# Patient Record
Sex: Male | Born: 2000 | Race: White | Hispanic: No | Marital: Single | Smoking: Former smoker
Health system: Southern US, Community
[De-identification: ages and names within clinical notes are randomized; demographics above are authoritative.]

---
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
## 2019-04-26 IMAGING — DX DG CHEST 1V PORT
1 series · 1 of 1 positions shown · non-contrast
Comparison: Chest x-rays from earlier same day.

CLINICAL DATA: Chest x-ray from earlier same day. Increased heart
rate, chest pain, N/V, and sensation of decreased air intake. Chest
tube has been in place for hemothorax.

EXAM:
PORTABLE CHEST 1 VIEW

[chest ap]
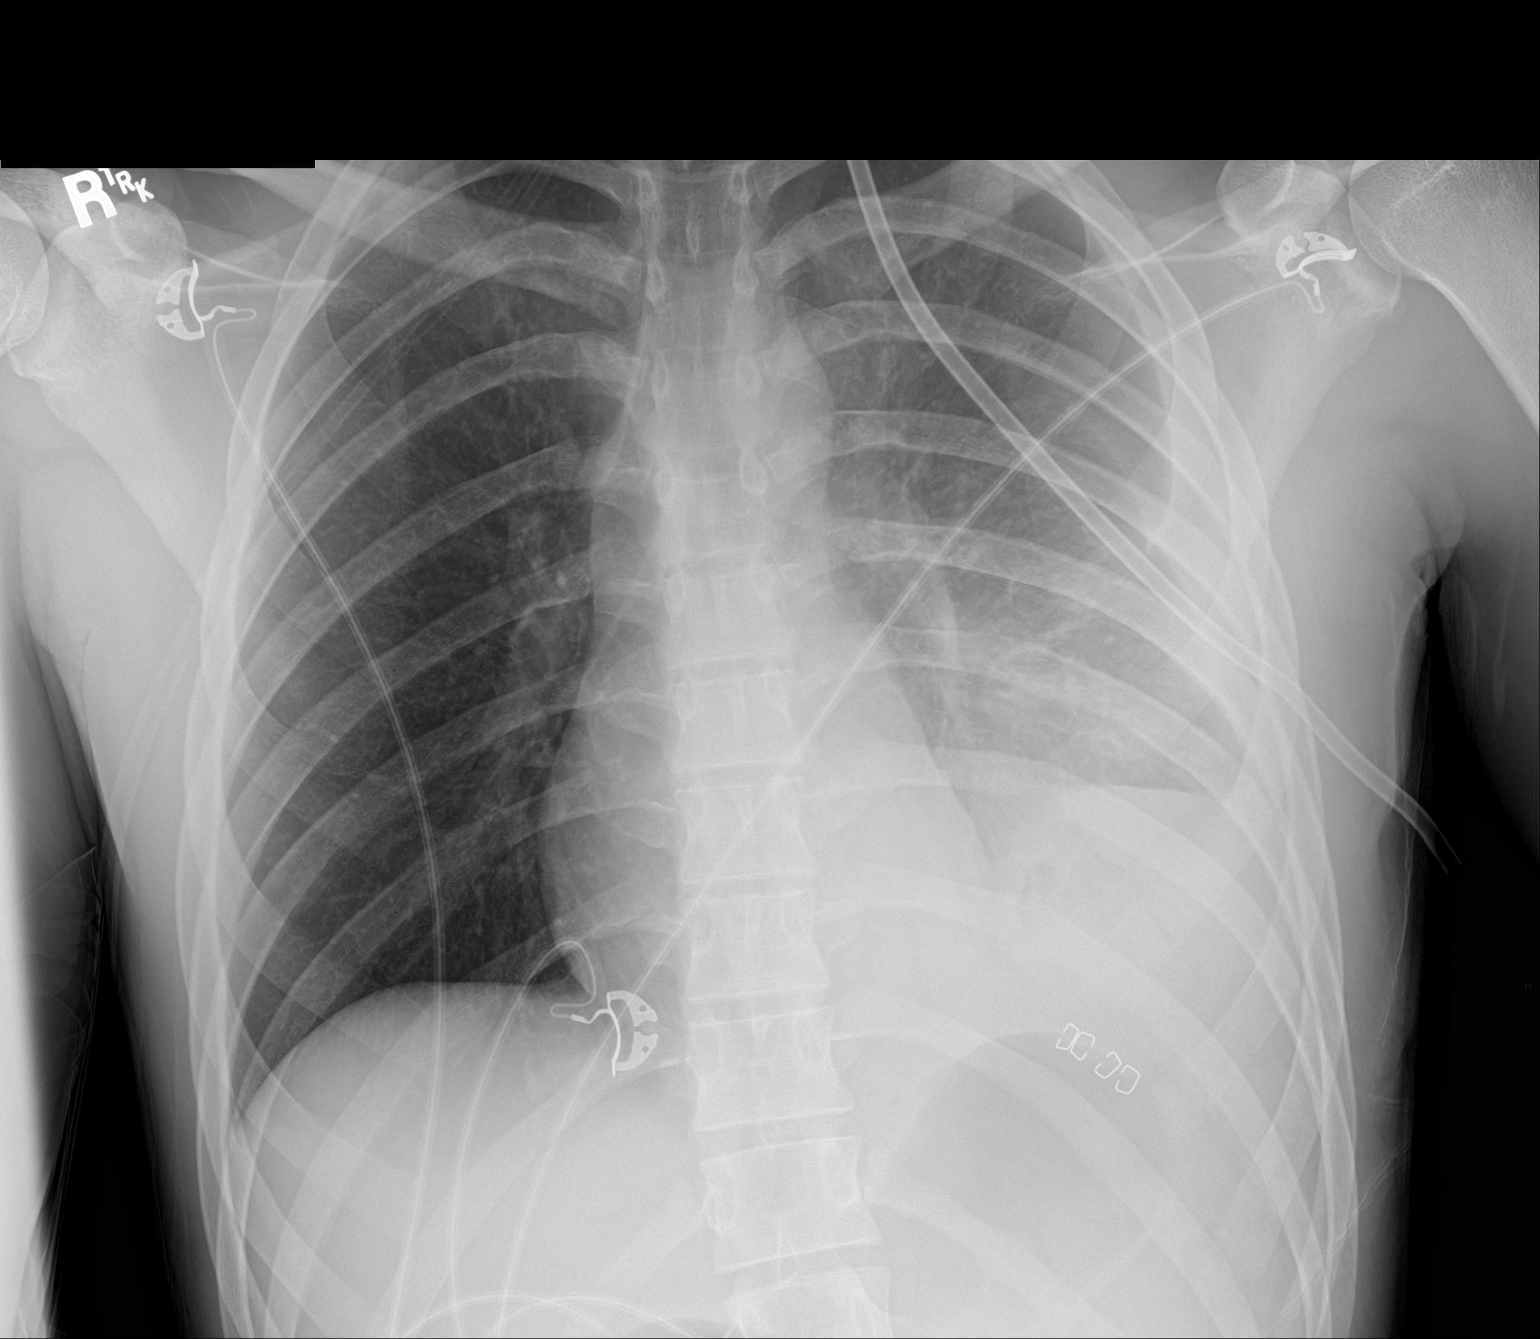

[1 of 1 positions shown; findings below may reference images not displayed]

FINDINGS: Left-sided chest tube is stable in position with tip directed
towards the left lung apex. There is increasing pleural fluid at the
left lung base, with air-fluid level indicating hydropneumothorax.
The component of pneumothorax previously demonstrated at the left
lung apex is no longer seen.

Cardiomediastinal structures are appropriately located in the
midline. Heart size is normal. Right lung is clear.
IMPRESSION: 1. Increasing pleural fluid at the left lung base, with air-fluid
level indicating hydropneumothorax.
2. The component of the previously demonstrated pneumothorax at the
left lung apex is no longer seen status post chest tube placement.
3. Right lung is clear.

These results will be called to the ordering clinician or
representative by the Radiologist Assistant, and communication
documented in the PACS or zVision Dashboard.

## 2019-04-27 IMAGING — DX DG CHEST 1V PORT
1 series · 1 of 1 positions shown · non-contrast
Comparison: Chest radiograph from earlier today.

CLINICAL DATA: Left hemothorax status post chest tube placement.
Central line placement.

EXAM:
PORTABLE CHEST 1 VIEW

[chest ap]
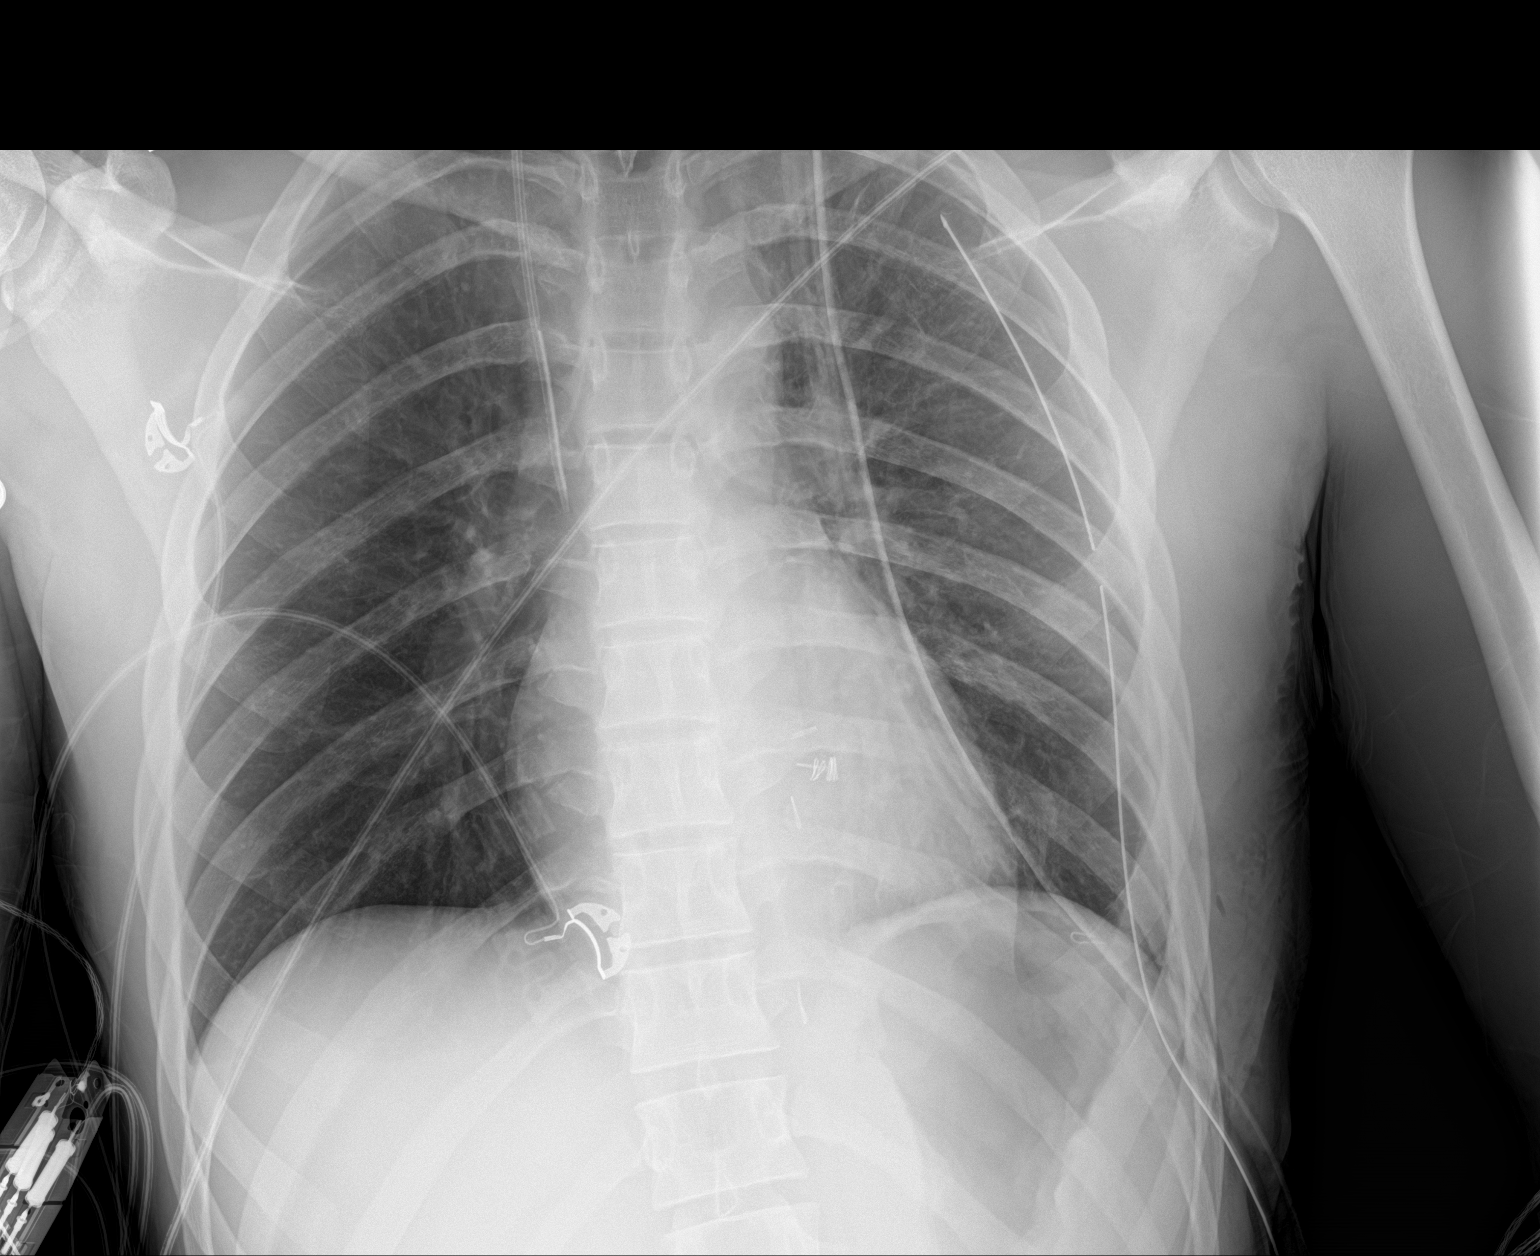

[1 of 1 positions shown; findings below may reference images not displayed]

FINDINGS: Right internal jugular central venous catheter terminates in the
middle third of the superior vena cava. Two left chest tubes are in
place terminating in the apical left pleural space. Surgical clips
overlie left lower chest. Mild subcutaneous emphysema in the lateral
lower left chest wall. Stable cardiomediastinal silhouette with
normal heart size. No evident pneumothorax. No pulmonary edema. No
pleural effusions. No acute consolidative airspace disease. Minimal
left basilar atelectasis, decreased.
IMPRESSION: 1. Two left apical chest tubes are in place. No pneumothorax. No
pleural effusions.
2. Right internal jugular central venous catheter terminates in the
middle third of the SVC.
3. Minimal residual atelectasis at the left lung base, decreased.

## 2019-04-29 IMAGING — DX DG CHEST 1V PORT
1 series · 1 of 1 positions shown · non-contrast
Comparison: 04/11/2017 and CT chest 04/10/2017.

CLINICAL DATA: Chest tubes, left VATS, hemothorax drainage. Left
chest pain and numbness.

EXAM:
PORTABLE CHEST 1 VIEW

[chest ap]
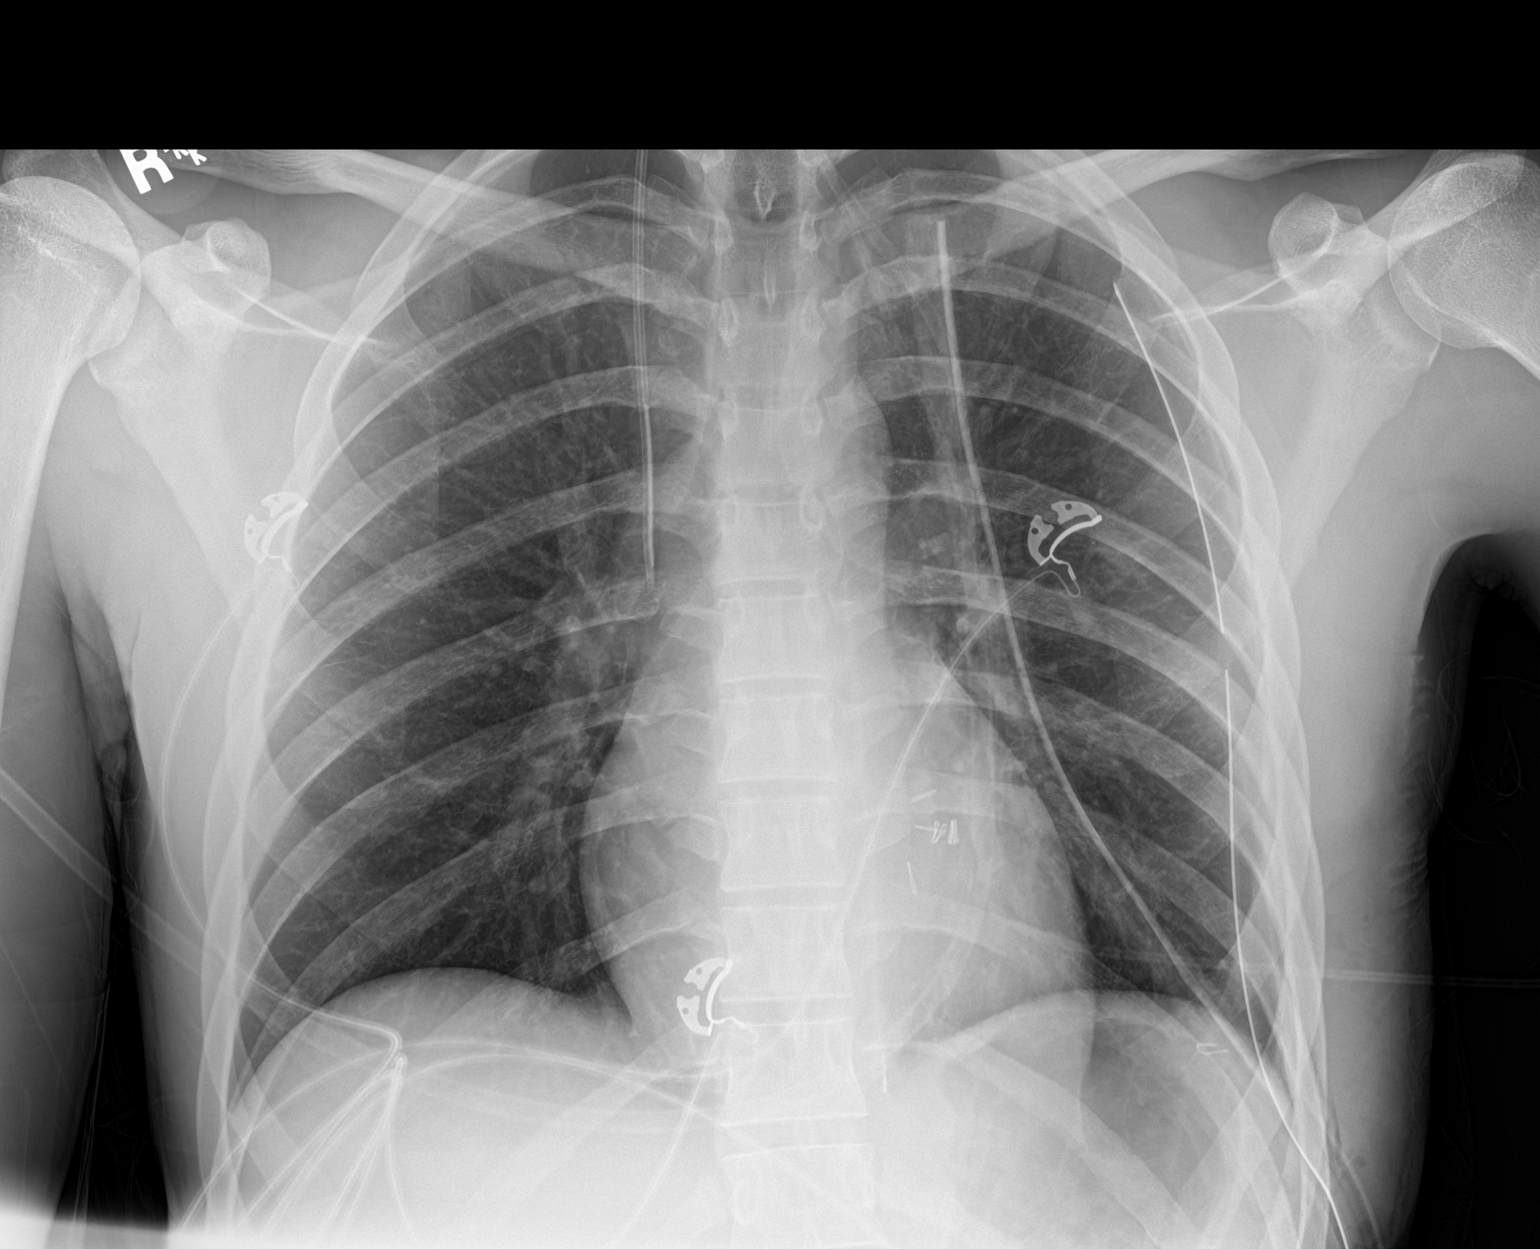

[1 of 1 positions shown; findings below may reference images not displayed]

FINDINGS: Right IJ central line tip projects over the SVC. Two left chest
tubes terminate in the upper left hemithorax. No definite
pneumothorax. Lungs are clear. No pleural fluid. Small amount of
subcutaneous air is seen in the lower left chest wall.
IMPRESSION: Two left chest tubes in place without pneumothorax or pleural fluid.

## 2019-04-30 IMAGING — DX DG CHEST 1V PORT
1 series · 1 of 1 positions shown · non-contrast
Comparison: April 12, 2017

CLINICAL DATA: Chest tube removal

EXAM:
PORTABLE CHEST 1 VIEW

[chest ap]
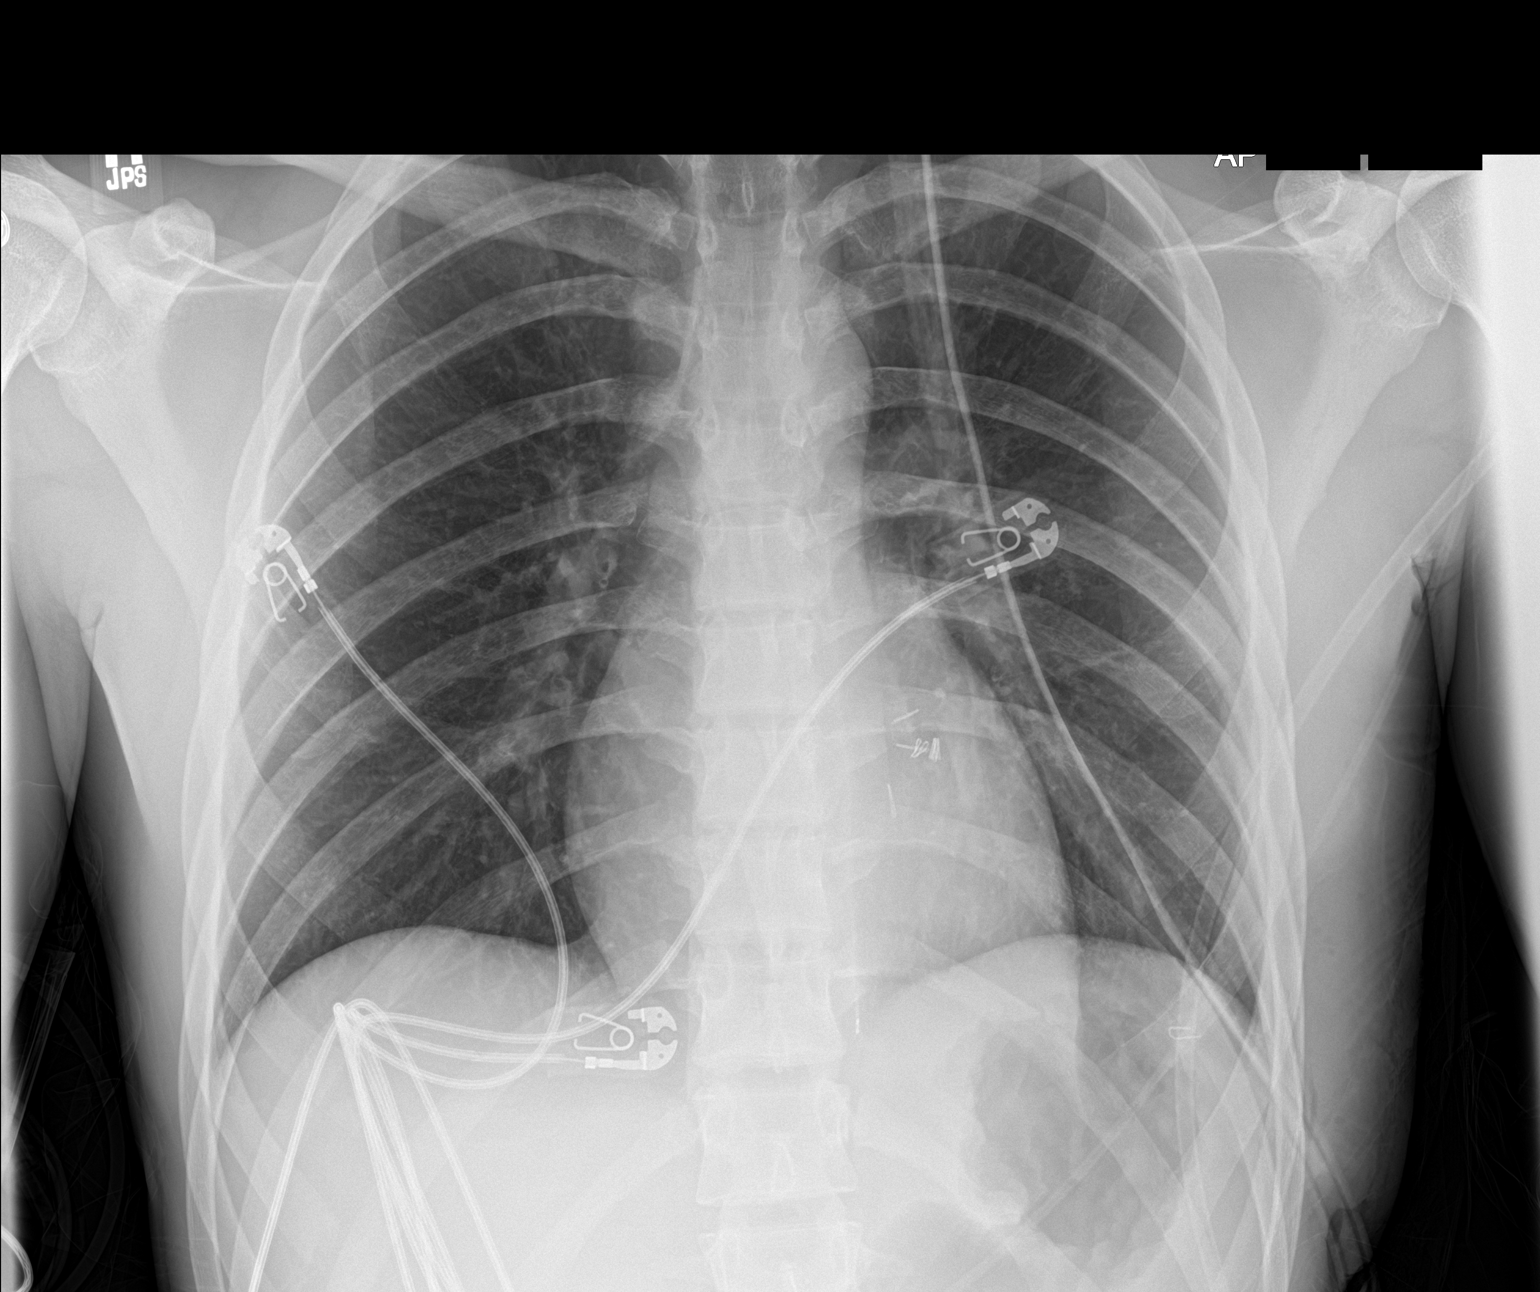

[1 of 1 positions shown; findings below may reference images not displayed]

FINDINGS: The more laterally positioned chest tube on the left has been
removed. Central catheter is been removed. Chest tube remains more
medially on the left. There is a small left apical pneumothorax
without tension component.

There is postoperative change on the left inferomedially. No edema
or consolidation. Heart size and pulmonary vascularity are normal.
No adenopathy. No bone lesions.
IMPRESSION: More medial chest tube remains on the left with the more lateral
chest tube removed. Central catheter also removed. There is a small
left apical pneumothorax without tension component. No edema or
consolidation. Stable cardiac silhouette.

## 2023-09-30 ENCOUNTER — Ambulatory Visit (HOSPITAL_BASED_OUTPATIENT_CLINIC_OR_DEPARTMENT_OTHER)
Admission: RE | Admit: 2023-09-30 | Discharge: 2023-09-30 | Disposition: A | Discharge status: Home / Self Care | Source: Ambulatory Visit | Attending: Family Medicine | Admitting: Family Medicine

## 2023-09-30 ENCOUNTER — Encounter (HOSPITAL_BASED_OUTPATIENT_CLINIC_OR_DEPARTMENT_OTHER): Payer: Self-pay

## 2023-09-30 VITALS — BP 143/89 | HR 73 | Temp 99.0°F | Resp 20

## 2023-09-30 DIAGNOSIS — Z711 Person with feared health complaint in whom no diagnosis is made: Secondary | ICD-10-CM

## 2023-09-30 LAB — POCT URINE DIPSTICK
Bilirubin, UA: NEGATIVE
Blood, UA: NEGATIVE
Glucose, UA: NEGATIVE mg/dL
Ketones, POC UA: NEGATIVE mg/dL
Leukocytes, UA: NEGATIVE
Nitrite, UA: NEGATIVE
POC PROTEIN,UA: NEGATIVE
Spec Grav, UA: 1.02 (ref 1.010–1.025)
Urobilinogen, UA: 0.2 U/dL
pH, UA: 8.5 — AB (ref 5.0–8.0)

## 2023-09-30 MED ORDER — CEFTRIAXONE SODIUM 1 G IJ SOLR
1.0000 g | Freq: Once | INTRAMUSCULAR | Status: AC
  Administered 2023-09-30: 1 g via INTRAMUSCULAR

## 2023-09-30 MED ORDER — AZITHROMYCIN 500 MG PO TABS
1000.0000 mg | ORAL_TABLET | Freq: Once | ORAL | Status: AC
  Administered 2023-09-30: 1000 mg via ORAL

## 2023-09-30 NOTE — ED Provider Notes (Signed)
 [facility]    CSN: [id number] Arrival date & time: 09/30/23  1520      History   Chief Complaint Chief Complaint  Patient presents with   SEXUALLY TRANSMITTED DISEASE    burning and pain with urination, testicular pain, frequent urination, bladder pain - Entered by patient    HPI Briscoe Daniello is a 23 y.o. male.   Patient is a 23 year old male who presents today with concerns for STD.  Patient reporting burning with urination, frequent urination, transient testicular pain. Reports symptoms x approx 3 weeks. States abd pain as well. Denies discharge. States was intimate with a woman approx 4 weeks ago who was on her menstrual cycle and unsure if that created more bacteria. Father with hx of bladder cancer.      History reviewed. No pertinent past medical history.  Patient Active Problem List   Diagnosis Date Noted   Second degree AV block    Hemothorax 04/10/2017   Hemopneumothorax on left 04/09/2017    Past Surgical History:  Procedure Laterality Date   VIDEO ASSISTED THORACOSCOPY (VATS)/EMPYEMA Left 04/10/2017   Procedure: VIDEO ASSISTED THORACOSCOPY (VATS)DRAINAGE HEMOTHORAX;  Surgeon: Fleeta Hanford Coy, MD;  Location: [facility] OR;  Service: Thoracic;  Laterality: Left;       Home Medications    Prior to Admission medications   Not on File    Family History Family History  Problem Relation Age of Onset   Hypertension Father     Social History Social History   Tobacco Use   Smoking status: Former   Smokeless tobacco: Never  Vaping Use   Vaping status: Never Used  Substance Use Topics   Alcohol use: Yes   Drug use: Never     Allergies   Aspirin   Review of Systems Review of Systems See HPI  Physical Exam Triage Vital Signs ED Triage Vitals  Encounter Vitals Group     BP 09/30/23 1548 (!) 143/89     Girls Systolic BP Percentile --      Girls Diastolic BP Percentile --      Boys Systolic BP Percentile --      Boys Diastolic BP  Percentile --      Pulse Rate 09/30/23 1548 73     Resp 09/30/23 1548 20     Temp 09/30/23 1548 99 F (37.2 C)     Temp Source 09/30/23 1548 Oral     SpO2 09/30/23 1548 98 %     Weight --      Height --      Head Circumference --      Peak Flow --      Pain Score 09/30/23 1550 5     Pain Loc --      Pain Education --      Exclude from Growth Chart --    No data found.  Updated Vital Signs BP (!) 143/89 (BP Location: Right Arm)   Pulse 73   Temp 99 F (37.2 C) (Oral)   Resp 20   SpO2 98%   Visual Acuity Right Eye Distance:   Left Eye Distance:   Bilateral Distance:    Right Eye Near:   Left Eye Near:    Bilateral Near:     Physical Exam Constitutional:      Appearance: Normal appearance.  Pulmonary:     Effort: Pulmonary effort is normal.  Musculoskeletal:        General: Normal range of motion.  Neurological:  Mental Status: He is alert.  Psychiatric:        Mood and Affect: Mood normal.      UC Treatments / Results  Labs (all labs ordered are listed, but only abnormal results are displayed) Labs Reviewed  POCT URINE DIPSTICK - Abnormal; Notable for the following components:      Result Value   Color, UA light yellow (*)    Clarity, UA cloudy (*)    pH, UA 8.5 (*)    All other components within normal limits  HIV ANTIBODY (ROUTINE TESTING W REFLEX)  RPR  CYTOLOGY, (ORAL, ANAL, URETHRAL) ANCILLARY ONLY    EKG   Radiology No results found.  Procedures Procedures (including critical care time)  Medications Ordered in UC Medications  azithromycin  (ZITHROMAX ) tablet 1,000 mg (1,000 mg Oral Given 09/30/23 1630)  cefTRIAXone  (ROCEPHIN ) injection 1 g (1 g Intramuscular Given 09/30/23 1631)    Initial Impression / Assessment and Plan / UC Course  I have reviewed the triage vital signs and the nursing notes.  Pertinent labs & imaging results that were available during my care of the patient were reviewed by me and considered in my medical  decision making (see chart for details).     Concern for STD in male without diagnosis-urine cloudy here today but otherwise did not show any signs of urinary tract infection.  Did have foul smell.  He is also had some penile irritation, itching and testicular discomfort.  Concern for possible STD at this time.  Will go ahead and treat prophylactically today with for gonorrhea and chlamydia with Rocephin  and azithromycin . Swab obtained here today for testing Also obtain blood work for further STD testing Will call with any positive results. Follow-up as needed Final Clinical Impressions(s) / UC Diagnoses   Final diagnoses:  Concern about STD in male without diagnosis     Discharge Instructions      We have treated you today for potential gonorrhea and chlamydia.  We are sending a swab for further testing. Will call you with any positive results.  We are also checking for HIV.  You can check your MyChart also for results.     ED Prescriptions   None    PDMP not reviewed this encounter.   Adah Wilbert LABOR, FNP 09/30/23 [phone]

## 2023-09-30 NOTE — ED Triage Notes (Signed)
 Patient reporting burning with urination, frequent urination, transient testicular pain. Reports symptoms x approx 3 weeks. States abd pain as well. Denies discharge. States was intimate with a woman approx 4 weeks ago who was on her menstrual cycle and unsure if that created more bacteria. Father with hx of bladder cancer.

## 2023-09-30 NOTE — Discharge Instructions (Signed)
 We have treated you today for potential gonorrhea and chlamydia.  We are sending a swab for further testing. Will call you with any positive results.  We are also checking for HIV.  You can check your MyChart also for results.

## 2023-09-30 NOTE — ED Notes (Signed)
 No adverse reactions from IM Rocephin . Patient discharged.

## 2023-10-01 LAB — CYTOLOGY, (ORAL, ANAL, URETHRAL) ANCILLARY ONLY
Chlamydia: NEGATIVE
Comment: NEGATIVE
Comment: NEGATIVE
Comment: NORMAL
Neisseria Gonorrhea: NEGATIVE
Trichomonas: NEGATIVE

## 2023-10-05 LAB — HIV ANTIBODY (ROUTINE TESTING W REFLEX): HIV Screen 4th Generation wRfx: NONREACTIVE

## 2023-10-05 LAB — SPECIMEN STATUS REPORT

## 2023-10-05 LAB — RPR: RPR Ser Ql: NONREACTIVE

## 2023-11-22 ENCOUNTER — Encounter (HOSPITAL_BASED_OUTPATIENT_CLINIC_OR_DEPARTMENT_OTHER): Payer: Self-pay

## 2023-11-22 ENCOUNTER — Ambulatory Visit (HOSPITAL_BASED_OUTPATIENT_CLINIC_OR_DEPARTMENT_OTHER)
Admission: EM | Admit: 2023-11-22 | Discharge: 2023-11-22 | Disposition: A | Discharge status: Home / Self Care | Attending: Family Medicine | Admitting: Family Medicine

## 2023-11-22 DIAGNOSIS — R590 Localized enlarged lymph nodes: Secondary | ICD-10-CM | POA: Diagnosis present

## 2023-11-22 DIAGNOSIS — R8290 Unspecified abnormal findings in urine: Secondary | ICD-10-CM | POA: Diagnosis present

## 2023-11-22 DIAGNOSIS — N451 Epididymitis: Secondary | ICD-10-CM | POA: Insufficient documentation

## 2023-11-22 DIAGNOSIS — N50812 Left testicular pain: Secondary | ICD-10-CM | POA: Insufficient documentation

## 2023-11-22 DIAGNOSIS — N50811 Right testicular pain: Secondary | ICD-10-CM | POA: Insufficient documentation

## 2023-11-22 DIAGNOSIS — N4889 Other specified disorders of penis: Secondary | ICD-10-CM | POA: Diagnosis present

## 2023-11-22 DIAGNOSIS — R300 Dysuria: Secondary | ICD-10-CM | POA: Insufficient documentation

## 2023-11-22 LAB — POCT URINE DIPSTICK
Bilirubin, UA: NEGATIVE
Blood, UA: NEGATIVE
Glucose, UA: NEGATIVE mg/dL
Ketones, POC UA: NEGATIVE mg/dL
Leukocytes, UA: NEGATIVE
Nitrite, UA: NEGATIVE
Protein Ur, POC: NEGATIVE mg/dL
Spec Grav, UA: 1.025 (ref 1.010–1.025)
Urobilinogen, UA: 0.2 U/dL
pH, UA: 7 (ref 5.0–8.0)

## 2023-11-22 MED ORDER — SULFAMETHOXAZOLE-TRIMETHOPRIM 800-160 MG PO TABS: 1.0000 | ORAL_TABLET | Freq: Two times a day (BID) | ORAL | 0 refills | Status: AC

## 2023-11-22 NOTE — ED Provider Notes (Signed)
 [facility]    CSN: [id number] Arrival date & time: 11/22/23  1628      History   Chief Complaint Chief Complaint  Patient presents with   Penis Pain   Testicle Pain   Urinary Frequency    HPI Harvie Italiano is a 23 y.o. male.   Patient was seen on 09/30/2023 here at this urgent care.  He was having penile pain, testicular pain, some penile burning with urination and dark urine and urinary frequency.  He was tested for STIs and everything was negative (HIV, RPR, gonorrhea, chlamydia, trichomoniasis all negative).  Patient reports she still having penile pain, testicular pain, urinary frequency and dark cloudy urine.  He denies any penile discharge, burning with urination, rash or lesions.  He has not taken anything for this pain.   Penis Pain Pertinent negatives include no chest pain and no abdominal pain.  Testicle Pain Pertinent negatives include no chest pain and no abdominal pain.  Urinary Frequency Pertinent negatives include no chest pain and no abdominal pain.    History reviewed. No pertinent past medical history.  Patient Active Problem List   Diagnosis Date Noted   Second degree AV block    Hemothorax 04/10/2017   Hemopneumothorax on left 04/09/2017    Past Surgical History:  Procedure Laterality Date   VIDEO ASSISTED THORACOSCOPY (VATS)/EMPYEMA Left 04/10/2017   Procedure: VIDEO ASSISTED THORACOSCOPY (VATS)DRAINAGE HEMOTHORAX;  Surgeon: Fleeta Hanford Coy, MD;  Location: [facility] OR;  Service: Thoracic;  Laterality: Left;       Home Medications    Prior to Admission medications   Medication Sig Start Date End Date Taking? Authorizing Provider  sulfamethoxazole-trimethoprim (BACTRIM DS) 800-160 MG tablet Take 1 tablet by mouth 2 (two) times daily for 14 days. 11/22/23 12/06/23 Yes Ival Domino, FNP    Family History Family History  Problem Relation Age of Onset   Hypertension Father     Social History Social History   Tobacco Use    Smoking status: Former   Smokeless tobacco: Never  [employer]   Vaping status: Never Used  Substance Use Topics   Alcohol use: Yes   Drug use: Never     Allergies   Aspirin   Review of Systems Review of Systems  Constitutional:  Negative for chills and fever.  HENT:  Negative for ear pain and sore throat.   Eyes:  Negative for pain and visual disturbance.  Respiratory:  Negative for cough.   Cardiovascular:  Negative for chest pain and palpitations.  Gastrointestinal:  Negative for abdominal pain, constipation, diarrhea, nausea and vomiting.  Genitourinary:  Positive for dysuria, frequency, penile pain and testicular pain. Negative for hematuria.  Musculoskeletal:  Negative for arthralgias and back pain.  Skin:  Negative for color change and rash.  Neurological:  Negative for seizures and syncope.  All other systems reviewed and are negative.    Physical Exam Triage Vital Signs ED Triage Vitals  Encounter Vitals Group     BP 11/22/23 1737 (!) 146/92     Girls Systolic BP Percentile --      Girls Diastolic BP Percentile --      Boys Systolic BP Percentile --      Boys Diastolic BP Percentile --      Pulse Rate 11/22/23 1737 79     Resp 11/22/23 1737 20     Temp 11/22/23 1737 98.3 F (36.8 C)     Temp Source 11/22/23 1737 Oral     SpO2 11/22/23  1737 96 %     Weight --      Height --      Head Circumference --      Peak Flow --      Pain Score 11/22/23 1735 3     Pain Loc --      Pain Education --      Exclude from Growth Chart --    No data found.  Updated Vital Signs BP (!) 146/92 (BP Location: Right Arm)   Pulse 79   Temp 98.3 F (36.8 C) (Oral)   Resp 20   SpO2 96%   Visual Acuity Right Eye Distance:   Left Eye Distance:   Bilateral Distance:    Right Eye Near:   Left Eye Near:    Bilateral Near:     Physical Exam Vitals and nursing note reviewed. Exam conducted with a chaperone present LONNY Sayres, CMA).  Constitutional:      General: He is  not in acute distress.    Appearance: He is well-developed. He is not ill-appearing or toxic-appearing.  HENT:     Head: Normocephalic and atraumatic.     Right Ear: Hearing, tympanic membrane, ear canal and external ear normal.     Left Ear: Hearing, tympanic membrane, ear canal and external ear normal.     Nose: No congestion or rhinorrhea.     Right Sinus: No maxillary sinus tenderness or frontal sinus tenderness.     Left Sinus: No maxillary sinus tenderness or frontal sinus tenderness.     Mouth/Throat:     Lips: Pink.     Mouth: Mucous membranes are moist.     Pharynx: Uvula midline. No oropharyngeal exudate or posterior oropharyngeal erythema.     Tonsils: No tonsillar exudate.  Eyes:     Conjunctiva/sclera: Conjunctivae normal.     Pupils: Pupils are equal, round, and reactive to light.  Cardiovascular:     Rate and Rhythm: Normal rate and regular rhythm.     Heart sounds: S1 normal and S2 normal. No murmur heard. Pulmonary:     Effort: Pulmonary effort is normal. No respiratory distress.     Breath sounds: Normal breath sounds. No decreased breath sounds, wheezing, rhonchi or rales.  Abdominal:     General: Bowel sounds are normal.     Palpations: Abdomen is soft.     Tenderness: There is no abdominal tenderness.     Hernia: There is no hernia in the left inguinal area or right inguinal area.  Genitourinary:    Pubic Area: No rash or pubic lice.      Penis: Circumcised. No phimosis, paraphimosis, hypospadias, erythema, tenderness, discharge, swelling or lesions.      Testes:        Right: Tenderness (Minimal - mild) present. Mass, swelling, testicular hydrocele or varicocele not present. Right testis is descended. Cremasteric reflex is present.         Left: Tenderness (Minimal - mild) present. Mass, swelling, testicular hydrocele or varicocele not present. Left testis is descended. Cremasteric reflex is present.      Epididymis:     Right: Not inflamed or enlarged.  Tenderness present. No mass.     Left: Not inflamed or enlarged. Tenderness present. No mass.     Comments: Did not perform a digital rectal exam. Musculoskeletal:        General: No swelling.     Cervical back: Neck supple.  Lymphadenopathy:     Head:     Right  side of head: No submental, submandibular, tonsillar, preauricular or posterior auricular adenopathy.     Left side of head: No submental, submandibular, tonsillar, preauricular or posterior auricular adenopathy.     Cervical: No cervical adenopathy.     Right cervical: No superficial cervical adenopathy.    Left cervical: No superficial cervical adenopathy.     Lower Body: Right inguinal adenopathy present. Left inguinal adenopathy present.  Skin:    General: Skin is warm and dry.     Capillary Refill: Capillary refill takes less than 2 seconds.     Findings: No rash.  Neurological:     Mental Status: He is alert and oriented to person, place, and time.  Psychiatric:        Mood and Affect: Mood normal.      UC Treatments / Results  Labs (all labs ordered are listed, but only abnormal results are displayed) Labs Reviewed  POCT URINE DIPSTICK - Normal  URINE CULTURE    EKG   Radiology No results found.  Procedures Procedures (including critical care time)  Medications Ordered in UC Medications - No data to display  Initial Impression / Assessment and Plan / UC Course  I have reviewed the triage vital signs and the nursing notes.  Pertinent labs & imaging results that were available during my care of the patient were reviewed by me and considered in my medical decision making (see chart for details).  Plan of Care: Epididymitis with testicular pain, lymphadenopathy, penile pain and cloudy urine: UA is normal.  Urine culture sent.  Bactrim DS, 1 pill twice daily for 14 days for epididymitis.  Get plenty of fluids.  Will adjust the plan of care, if needed once the urine culture results.  Complete the Bactrim  DS for a full 14 days, even if the urine culture is negative.  The Bactrim DS is for epididymitis.  Strongly encouraged to get supportive underwear or get an athletic supporter to wear over his underwear for more support of his testicles.  If symptoms persist after treatment with Bactrim DS, needs to see urology for further workup.  Follow-up here as needed.  I reviewed the plan of care with the patient and/or the patient's guardian.  The patient and/or guardian had time to ask questions and acknowledged that the questions were answered.  I provided instruction on symptoms or reasons to return here or to go to an ER, if symptoms/condition did not improve, worsened or if new symptoms occurred.  Final Clinical Impressions(s) / UC Diagnoses   Final diagnoses:  Superficial inguinal lymphadenopathy  Bilateral epididymitis  Pain in both testicles  Penile pain  Cloudy urine  Dysuria     Discharge Instructions      Epididymitis with testicular pain, lymphadenopathy, penile pain and cloudy urine: UA is normal.  Urine culture sent.  Bactrim DS, 1 pill twice daily for 14 days for epididymitis.  Get plenty of fluids.  Will adjust the plan of care, if needed once the urine culture results.  Complete the Bactrim DS for a full 14 days, even if the urine culture is negative.  The Bactrim DS is for epididymitis.  Strongly encouraged to get supportive underwear or get an athletic supporter to wear over his underwear for more support of his testicles.  If symptoms persist after treatment with Bactrim DS, needs to see urology for further workup.  Follow-up here as needed.     ED Prescriptions     Medication Sig Dispense Auth. Provider  sulfamethoxazole-trimethoprim (BACTRIM DS) 800-160 MG tablet Take 1 tablet by mouth 2 (two) times daily for 14 days. 28 tablet Ferdinando Lodge, FNP      PDMP not reviewed this encounter.   Ival Domino, FNP 11/22/23 [phone]

## 2023-11-22 NOTE — ED Triage Notes (Signed)
 Pt states he is still having issues with penile pain, testicular pain, urinary frequency, dark, and cloudy urine. He has been having these symptoms off and on since September. Pt denies rash, discharge or dysuria. Pt has not taken anything for his pain.

## 2023-11-22 NOTE — Discharge Instructions (Addendum)
 Epididymitis with testicular pain, lymphadenopathy, penile pain and cloudy urine: UA is normal.  Urine culture sent.  Bactrim DS, 1 pill twice daily for 14 days for epididymitis.  Get plenty of fluids.  Will adjust the plan of care, if needed once the urine culture results.  Complete the Bactrim DS for a full 14 days, even if the urine culture is negative.  The Bactrim DS is for epididymitis.  Strongly encouraged to get supportive underwear or get an athletic supporter to wear over his underwear for more support of his testicles.  If symptoms persist after treatment with Bactrim DS, needs to see urology for further workup.  Follow-up here as needed.

## 2023-11-24 ENCOUNTER — Telehealth (HOSPITAL_BASED_OUTPATIENT_CLINIC_OR_DEPARTMENT_OTHER): Payer: Self-pay

## 2023-11-24 ENCOUNTER — Ambulatory Visit (HOSPITAL_COMMUNITY): Payer: Self-pay

## 2023-11-24 LAB — URINE CULTURE: Culture: <10000 — AB

## 2023-11-25 NOTE — Progress Notes (Signed)
 Urine culture is low colony count (less than 10,000).  Essentially not a UTI.  Complete the Bactrim DS for epididymitis.  Patient updated via voicemail message.  Encouraged to complete the antibiotics and follow-up as needed.
